# Patient Record
Sex: Female | Born: 1970 | Race: White | Hispanic: No | Marital: Married | Smoking: Former smoker
Health system: Southern US, Community
[De-identification: ages and names within clinical notes are randomized; demographics above are authoritative.]

## PROBLEM LIST (undated history)

## (undated) HISTORY — PX: DILATION AND CURETTAGE OF UTERUS: SHX78

## (undated) HISTORY — PX: CHOLECYSTECTOMY: SHX55

## (undated) HISTORY — PX: HERNIA REPAIR: SHX51

---

## 1998-08-14 ENCOUNTER — Emergency Department (HOSPITAL_COMMUNITY): Admission: EM | Admit: 1998-08-14 | Discharge: 1998-08-14 | Payer: Self-pay | Admitting: Emergency Medicine

## 1998-10-16 ENCOUNTER — Emergency Department (HOSPITAL_COMMUNITY): Admission: EM | Admit: 1998-10-16 | Discharge: 1998-10-16 | Payer: Self-pay | Admitting: *Deleted

## 1999-03-30 ENCOUNTER — Ambulatory Visit (HOSPITAL_COMMUNITY): Admission: RE | Admit: 1999-03-30 | Discharge: 1999-03-30 | Payer: Self-pay | Admitting: Obstetrics & Gynecology

## 2000-01-15 ENCOUNTER — Other Ambulatory Visit: Admission: RE | Admit: 2000-01-15 | Discharge: 2000-01-15 | Payer: Self-pay | Admitting: Obstetrics and Gynecology

## 2000-04-16 ENCOUNTER — Encounter: Payer: Self-pay | Admitting: Obstetrics & Gynecology

## 2000-04-16 ENCOUNTER — Ambulatory Visit (HOSPITAL_COMMUNITY): Admission: RE | Admit: 2000-04-16 | Discharge: 2000-04-16 | Payer: Self-pay | Admitting: Obstetrics & Gynecology

## 2000-07-18 ENCOUNTER — Inpatient Hospital Stay (HOSPITAL_COMMUNITY): Admission: AD | Admit: 2000-07-18 | Discharge: 2000-07-18 | Payer: Self-pay | Admitting: Obstetrics and Gynecology

## 2000-07-19 ENCOUNTER — Inpatient Hospital Stay (HOSPITAL_COMMUNITY): Admission: AD | Admit: 2000-07-19 | Discharge: 2000-07-19 | Payer: Self-pay | Admitting: Obstetrics and Gynecology

## 2000-07-23 ENCOUNTER — Observation Stay (HOSPITAL_COMMUNITY): Admission: AD | Admit: 2000-07-23 | Discharge: 2000-07-24 | Payer: Self-pay | Admitting: Obstetrics and Gynecology

## 2000-07-28 ENCOUNTER — Inpatient Hospital Stay (HOSPITAL_COMMUNITY): Admission: AD | Admit: 2000-07-28 | Discharge: 2000-07-28 | Payer: Self-pay | Admitting: Obstetrics and Gynecology

## 2000-07-28 ENCOUNTER — Encounter: Payer: Self-pay | Admitting: Obstetrics and Gynecology

## 2000-07-29 ENCOUNTER — Inpatient Hospital Stay (HOSPITAL_COMMUNITY): Admission: AD | Admit: 2000-07-29 | Discharge: 2000-07-29 | Payer: Self-pay | Admitting: *Deleted

## 2000-08-07 ENCOUNTER — Inpatient Hospital Stay (HOSPITAL_COMMUNITY): Admission: AD | Admit: 2000-08-07 | Discharge: 2000-08-09 | Payer: Self-pay | Admitting: Obstetrics and Gynecology

## 2000-08-07 ENCOUNTER — Inpatient Hospital Stay (HOSPITAL_COMMUNITY): Admission: AD | Admit: 2000-08-07 | Discharge: 2000-08-07 | Payer: Self-pay | Admitting: Obstetrics and Gynecology

## 2001-01-13 ENCOUNTER — Other Ambulatory Visit: Admission: RE | Admit: 2001-01-13 | Discharge: 2001-01-13 | Payer: Self-pay | Admitting: Obstetrics and Gynecology

## 2003-01-05 ENCOUNTER — Other Ambulatory Visit: Admission: RE | Admit: 2003-01-05 | Discharge: 2003-01-05 | Payer: Self-pay | Admitting: Family Medicine

## 2004-11-10 ENCOUNTER — Ambulatory Visit (HOSPITAL_COMMUNITY): Admission: RE | Admit: 2004-11-10 | Discharge: 2004-11-10 | Payer: Self-pay | Admitting: Obstetrics & Gynecology

## 2005-04-17 ENCOUNTER — Encounter: Admission: RE | Admit: 2005-04-17 | Discharge: 2005-05-09 | Payer: Self-pay | Admitting: Obstetrics

## 2005-05-26 ENCOUNTER — Inpatient Hospital Stay (HOSPITAL_COMMUNITY): Admission: AD | Admit: 2005-05-26 | Discharge: 2005-05-26 | Payer: Self-pay | Admitting: Obstetrics & Gynecology

## 2005-06-05 ENCOUNTER — Inpatient Hospital Stay (HOSPITAL_COMMUNITY): Admission: AD | Admit: 2005-06-05 | Discharge: 2005-06-05 | Payer: Self-pay | Admitting: Obstetrics

## 2005-06-16 ENCOUNTER — Inpatient Hospital Stay (HOSPITAL_COMMUNITY): Admission: AD | Admit: 2005-06-16 | Discharge: 2005-06-16 | Payer: Self-pay | Admitting: Obstetrics & Gynecology

## 2005-06-30 ENCOUNTER — Inpatient Hospital Stay (HOSPITAL_COMMUNITY): Admission: AD | Admit: 2005-06-30 | Discharge: 2005-06-30 | Payer: Self-pay | Admitting: Obstetrics & Gynecology

## 2005-07-08 ENCOUNTER — Inpatient Hospital Stay (HOSPITAL_COMMUNITY): Admission: AD | Admit: 2005-07-08 | Discharge: 2005-07-10 | Payer: Self-pay | Admitting: Maternal and Fetal Medicine

## 2006-02-04 ENCOUNTER — Ambulatory Visit (HOSPITAL_COMMUNITY): Admission: RE | Admit: 2006-02-04 | Discharge: 2006-02-04 | Payer: Self-pay | Admitting: Obstetrics

## 2007-05-23 ENCOUNTER — Other Ambulatory Visit: Admission: RE | Admit: 2007-05-23 | Discharge: 2007-05-23 | Payer: Self-pay | Admitting: Obstetrics and Gynecology

## 2011-01-01 ENCOUNTER — Encounter: Payer: Self-pay | Admitting: Gynecology

## 2011-04-27 NOTE — H&P (Signed)
[facility]  Patient:    Terry Woods, Terry Woods                        MRN: [id number] Adm. Date:  [id number] Disc. Date: [id number] Attending:  Leonard Schwartz Dictator:   Nigel Bridgeman, C.N.M.                         History and Physical  HISTORY OF PRESENT ILLNESS:   Terry Woods is a 40 year old gravida 4, para 2-0-1-2 at 37-3/7 weeks who presents in early labor with a history of several admissions for prodromal labor over the last two weeks.  She also had an external version scheduled on Friday, July 19, 2000, from a transverse lie; however, the fetus at that time had converted to a vertex presentation although the vertex was high.  Her cervix was external os 3, internal os 1-2 at that time.  Over the weekend the patient has had increasing uterine contractions.  She denies any leaking or bleeding and reports positive fetal movement.  The pregnancy has been remarkable for: 1. Patient considering tubal ligation, but undecided at present. 2. History of gestational diabetes with previous pregnancy. 3. Lives one hour from the hospital.  PRENATAL LABORATORY DATA:     Blood type is O positive, Rh antibody negative. VDRL nonreactive.  Rubella titer positive.  Hepatitis B surface antigen negative.  GC and chlamydia cultures were negative.  Parvovirus titers were negative.  Hemoglobin was 13 upon entry into practice, it was 12 at 28 weeks. She had an initial Glucola that was normal, and a 28-week Glucola that was also normal.  AFP was declined.  EDC of August 10, 2000, was established by last menstrual period and was in agreement with ultrasound in the first trimester as well as at 18 weeks.  Group B strep culture was negative at 36 weeks.  HISTORY OF PRESENT ILLNESS:   The patient entered care at approximately 10 weeks.  She had Parvovirus titers done in the first trimester.  She had an ultrasound at approximately 13 weeks.  She had a fasting blood sugar  at 17 weeks which was 66.  She had a Glucola that was normal at 28 weeks.  She had breech presentation at 25 weeks and followed up at 32-34 weeks.  She had external version scheduled at 36 weeks; however, the baby had converted from a transverse presentation.  At 36 weeks cervix was 3, external os fingertip to 1, internal os 50%, vertex was out of the pelvis and was determined to be transverse at ultrasound.  The last two weeks have been remarkable for several episodes of uterine contractions, and she was evaluated several times for this in maternity admissions.  OBSTETRICAL HISTORY:          In 1993, she had vaginal birth of a female infant, weight 7 pounds 7 ounces at [redacted] weeks gestation.  She was in labor approximately 28 hours.  She had epidural anesthesia.  In 1996, she had vaginal birth of a female infant, weight 6 pounds 11 ounces, at [redacted] weeks gestation.  She was in labor approximately 10 hours.  She had no epidural at that time.  She had gestational diabetes during that pregnancy.  That child also had hypospadias.  In 1999, she had a spontaneous miscarriage at 45- to 12-week gestation, had a D&C as follow-up.  PAST MEDICAL HISTORY:  She reports the usual childhood illnesses.  At age 41 she had pyelonephritis.  She was a half-pack-per-day smoker prior to pregnancy but has not smoked since then.  She fractured her right ankle as a teenager playing basketball.  PAST SURGICAL HISTORY:        1. D&C in 1999.                               2. Wisdom teeth removal in 1993.                               3. Pyelonephritis and was hospitalized at age                                  57.  ALLERGIES:                    No known medication allergies.  FAMILY HISTORY:               Her mother and maternal grandmother have hypertension.  Her maternal grandmother had phlebitis.  Her mother, maternal grandmother and maternal aunt all have non-insulin-dependent diabetes.  Her maternal  grandmother had a stroke.  Her maternal grandmother had arthritis. Her maternal grandmother had breast cancer.  GENETIC HISTORY:              The father and the infants brother having a heart murmur.  A maternal aunt had a loss of twins in the first trimester.  SOCIAL HISTORY:               She is married to the father of the baby.  He is involved and supportive.  His name is Terry Woods.  The patient is Caucasian.  She is of the [employer].  She is a housewife and high-school educated.  Her husband is a Naval architect, and he is also high-school educated. She has been followed by the certified nurse midwife service at [facility].  She denies any alcohol or drug use during this pregnancy.  She was a half-pack-per-day smoker prior to her positive urine pregnancy test.  PHYSICAL EXAMINATION:  VITAL SIGNS:                  Stable.  The patient is afebrile.  HEENT:                        Within normal limits.  LUNGS:                        Breath sounds are clear.  HEART:                        Regular.  Without murmur.  BREASTS:                      Soft and nontender.  ABDOMEN:                      Fundal height is approximately 38 cm.  Estimated fetal weight 7 to 7-1/2 pounds.  Uterine contractions every 2-8 minutes with irritability in between, mild to moderate quality.  PELVIC:  On cervical exam, 3-4 cm, 75%, vertex, at a -3 station at least, with the vertex presentation verified by bedside ultrasound. Fetal heart rate is reactive, with no decelerations.  EXTREMITIES:                  Deep tendon reflexes are 2+ without clonus. There is a trace edema noted.  IMPRESSION:                   1. Intrauterine pregnancy at 37-3/7 weeks.                               2. Routine certified nurse midwife orders.                               3. Anticipate initiating Pitocin augmentation to                                  assist with descent of fetal  vertex and then                                  artificial rupture of membranes as able. DD:  07/23/00 TD:  07/23/00 Job: [id number]

## 2011-04-27 NOTE — H&P (Signed)
[facility]  Patient:    Terry Woods, Terry Woods                        MRN: [id number] Adm. Date:  [id number] Attending:  Leonard Schwartz Dictator:   Wynelle Bourgeois, P.A.                         History and Physical  HISTORY OF PRESENT ILLNESS:   The patient is a 40 year old gravida 4, para 2-0-1-2, at 39-4/7 weeks who presents from the office today with increased uterine contractions with some progression in her cervical dilation to 3 cm. She also presented with hypertension in the office today at 140/90 and 1+ proteinuria on a clean catch UA, which was later confirmed as 100 mg on a cath UA.  She has been followed by the midwifery service at [facility] the first trimester and her pregnancy has been remarkable for history of gestational diabetes with her second pregnancy, possibly desires postpartum tubal ligation, and new onset preeclampsia.  PRENATAL LABORATORY DATA:     Hemoglobin 13.0, hematocrit 39.9, platelets 283. Blood type O-positive.  Antibody screen negative.  RPR nonreactive.  Rubella immune HBsAg negative.  Gonorrhea negative.  Chlamydia negative.  Parvovirus negative.  Glucose challenge within normal limits and group B Strep negative.  OBSTETRICAL HISTORY:          Remarkable for a vaginal delivery in March of 1993, of a female infant weighing 7 pounds and 7 ounces, complicated by a nuchal cord under epidural anesthesia.  Her second pregnancy was in October of 1996, with a vaginal delivery of a female infant weighing 6 pounds and 11 ounces at [redacted] weeks gestation, complicated by gestational diabetes, and an infant born with hypospadias and then in April of 1999, she had a spontaneous abortion at 9-[redacted] weeks gestation with a D&C.  No complications.  PAST MEDICAL HISTORY:         Remarkable for pyelonephritis at age 47.  PAST SURGICAL HISTORY:        Remarkable for fracture of her ankle as a teenager, wisdom tooth extraction in 1993, D&C  in 1999.  FAMILY HISTORY:               Remarkable for mother and grandmother with hypertension.  Mother and grandmother with non-insulin-dependent diabetes. Maternal grandmother with stroke and arthritis and breast cancer.  GENETIC HISTORY:              Remarkable for the father of the babys brother who has a heart murmur and a maternal aunt who had a miscarriage of twins.  SOCIAL HISTORY:               The patient is married to [employer] who is involved and supportive.  They are of the [facility] [employer] [facility] faith.  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  PHYSICAL EXAMINATION:  HEENT:                        Within normal limits, although remarkable for traced 1+ edema of face.  NECK:                         Thyroid is normal, not enlarged.  CHEST:  Clear to auscultation bilaterally.  HEART:                        Regular rate and rhythm.  ABDOMEN:                      Gravid at 39 cm, vertex Vale, soft, and nontender.  Electronic fetal monitoring reveals a reactive fetal heart rate tracing with average variability, and uterine contractions every two to four minutes.  CERVICAL EXAMINATION:         Is 4-5 cm, 80% effaced, and minus 1 station with a ballotable vertex and a bulging bag of water.  EXTREMITIES:                  Show 1+ edema of hands and legs.  LABORATORY DATA:              Cath UA reveals 100 mg of protein.  Platelets and hemoglobin within normal limits, and metabolic panel results are pending.  ASSESSMENT:                   1. Intrauterine pregnancy at 39-4/7 weeks.                               2. Preeclampsia.                               3. Early active labor.                               4. Group B Streptococcus negative.  PLAN:                         1. Admit to birthing suite.                               2. Dr. Kathryne Sharper notified with routine MD                                  orders.                                3. Magnesium sulfate 4 g loading dose IV, and                                  then 2 g/hr.                               4. Further orders per Dr. Kathryne Sharper. DD:  08/07/00 TD:  08/07/00 Job: [id number]

## 2011-04-27 NOTE — Discharge Summary (Signed)
[facility]  Patient:    Terry Woods, Terry Woods                        MRN: [id number] Adm. Date:  [id number] Disc. Date: [id number] Attending:  Shaune Spittle Dictator:   Terry Woods, C.N.M.                           Discharge Summary  ADMITTING DIAGNOSES:          1. Intrauterine pregnancy at 37 and                                  three-sevenths weeks.                               2. Prolonged term labor.                               3. ______ .  DISCHARGE DIAGNOSES:          1. Term pregnancy.                               2. Unfavorable cervix.                               3. Prolonged attempted labor augmentation.  PROCEDURES:                   Pitocin augmentation.  HOSPITAL COURSE:              Terry Woods is a 40 year old gravida 4, para 2-0-1-2 at 25 and three-sevenths weeks, who presented on July 23, 2000 with uterine contractions every five minutes.  The fetus was previously transversed with a spontaneous change to vertex last week.  She lives approximately one hour from the hospital.  She has had approximately one week of prodromal labor.  ______ :                     1. Considering tubal ligation but anticipates                                  deciding against it at this time.                               2. History of gestational diabetes in her                                  previous pregnancy.                               3. Prolonged prodromal labor.  HOSPITAL COURSE:              On admission, cervix was 3-4 cm, 75% vertex at a -3, -4 and ballottable.  Vertex was verified per bedside ultrasound.  This was a slight cervical change  since last Friday.  The patient was admitted for labor augmentation if no further progress was made.  Pitocin was begun approximately noon.  The patient was evaluated several times over the next 18 hours.  Cervix remained very soft, very high, and vertex was -3, -4, and ballottable.   Cervix did not change.  Fetal heart rate remained reactive with a negative OCT.  The patient did have good response to Pitocin with contractions every two to three minutes but never was significantly uncomfortable.  She did receive two doses of Stadol for comfort to which she responded well, but by 5:30 in the morning on July 24, 2000, cervix had demo no further change.  Fetal heart rate was reactive.  Maternal vital signs were stable.  Review of the plan and situation was undertaken with Dr. Pennie Rushing and the patient.  The decision was made to discharge the patient home secondary to unfavorable cervix and lack of necessity of forging ahead with further induction methods.  The patient was agreeable with this plan.  Therefore, she was discharged home.  DISCHARGE INSTRUCTIONS:       Per antenatal handout.  DISCHARGE MEDICATIONS:        None.  DISCHARGE FOLLOW-UP:          Will occur on Friday with Terry Woods, C.N.M. or p.r.n. DD:  07/24/00 TD:  07/24/00 Job: [id number]

## 2013-02-25 ENCOUNTER — Encounter (INDEPENDENT_AMBULATORY_CARE_PROVIDER_SITE_OTHER): Payer: Self-pay

## 2013-02-25 ENCOUNTER — Ambulatory Visit: Payer: Self-pay | Admitting: Obstetrics & Gynecology

## 2013-02-27 ENCOUNTER — Ambulatory Visit (INDEPENDENT_AMBULATORY_CARE_PROVIDER_SITE_OTHER): Payer: BC Managed Care – PPO | Admitting: Surgery

## 2013-02-27 ENCOUNTER — Encounter (INDEPENDENT_AMBULATORY_CARE_PROVIDER_SITE_OTHER): Payer: Self-pay | Admitting: Surgery

## 2013-02-27 VITALS — BP 116/72 | HR 72 | Temp 97.7°F | Resp 18 | Ht 61.5 in | Wt 194.6 lb

## 2013-02-27 DIAGNOSIS — K439 Ventral hernia without obstruction or gangrene: Secondary | ICD-10-CM

## 2013-02-27 NOTE — Patient Instructions (Signed)
Hernia A hernia occurs when an internal organ pushes out through a weak spot in the abdominal wall. Hernias most commonly occur in the groin and around the navel. Hernias often can be pushed back into place (reduced). Most hernias tend to get worse over time. Some abdominal hernias can get stuck in the opening (irreducible or incarcerated hernia) and cannot be reduced. An irreducible abdominal hernia which is tightly squeezed into the opening is at risk for impaired blood supply (strangulated hernia). A strangulated hernia is a medical emergency. Because of the risk for an irreducible or strangulated hernia, surgery may be recommended to repair a hernia. CAUSES   Heavy lifting.  Prolonged coughing.  Straining to have a bowel movement.  A cut (incision) made during an abdominal surgery. HOME CARE INSTRUCTIONS   Bed rest is not required. You may continue your normal activities.  Avoid lifting more than 10 pounds (4.5 kg) or straining.  Cough gently. If you are a smoker it is best to stop. Even the best hernia repair can break down with the continual strain of coughing. Even if you do not have your hernia repaired, a cough will continue to aggravate the problem.  Do not wear anything tight over your hernia. Do not try to keep it in with an outside bandage or truss. These can damage abdominal contents if they are trapped within the hernia sac.  Eat a normal diet.  Avoid constipation. Straining over long periods of time will increase hernia size and encourage breakdown of repairs. If you cannot do this with diet alone, stool softeners may be used. SEEK IMMEDIATE MEDICAL CARE IF:   You have a fever.  You develop increasing abdominal pain.  You feel nauseous or vomit.  Your hernia is stuck outside the abdomen, looks discolored, feels hard, or is tender.  You have any changes in your bowel habits or in the hernia that are unusual for you.  You have increased pain or swelling around the  hernia.  You cannot push the hernia back in place by applying gentle pressure while lying down. MAKE SURE YOU:   Understand these instructions.  Will watch your condition.  Will get help right away if you are not doing well or get worse. Document Released: 11/26/2005 Document Revised: 02/18/2012 Document Reviewed: 07/15/2008 [facility] Patient Information 2013 [place], [place].  Hernia, Surgical Repair Care After Refer to this sheet in the next few weeks. These discharge instructions provide you with general information on caring for yourself after you leave the hospital. Your caregiver may also give you specific instructions. Your treatment has been planned according to the most current medical practices available, but unavoidable complications sometimes occur. If you have any problems or questions after discharge, please call your caregiver. HOME CARE INSTRUCTIONS   It is normal to be sore for a couple weeks after surgery. See your caregiver if this seems to be getting worse rather than better.  Put ice on the operative site.  Put ice in a plastic bag.  Place a towel between your skin and the bag.  Leave the ice on for 15 to 20 minutes at a time, 3 to 4 times a day for the first 2 days.  Change bandages (dressings) as directed.  Keep the wound dry and clean. The wound may be washed gently with soap and water. Gently blot or dab the wound dry. Do not take baths, use swimming pools, or use hot tubs for 10 days, or as directed by your caregiver.  Only take over-the-counter or prescription medicines for pain, discomfort, or fever as directed by your caregiver.  Continue your normal diet as directed.  Do not drive until your caregiver says it is okay.  Do not lift anything more than 10 pounds or play contact sports for 3 weeks, or as directed.  Make an appointment to see your caregiver for stitches (sutures) or staple removal when instructed. SEEK MEDICAL CARE IF:   You have  increased bleeding coming from the wounds.  You have blood in your stool.  You see redness, swelling, or have increasing pain in the wounds.  You have fluid (pus) coming from the wound.  You have an oral temperature above 102 F (38.9 C).  You notice a bad smell coming from the wound or dressing.  You develop lightheadedness or feel faint. SEEK IMMEDIATE MEDICAL CARE IF:   You develop a rash.  You have difficulty breathing.  You develop any reaction or side effects to medicines given. MAKE SURE YOU:   Understand these instructions.  Will watch your condition.  Will get help right away if you are not doing well or get worse. Document Released: 06/15/2005 Document Revised: 02/18/2012 Document Reviewed: 10/26/2009 [facility] Patient Information 2013 [place], [place]. Hernia, Surgical Repair A hernia occurs when an internal organ pushes out through a weak spot in the belly (abdominal) wall muscles. Hernias commonly occur in the groin and around the navel. Hernias often can be pushed back into place (reduced). Most hernias tend to get worse over time. Problems occur when abdominal contents get stuck in the opening (incarcerated hernia). The blood supply gets cut off (strangulated hernia). This is an emergency and needs surgery. Otherwise, hernia repair can be an elective procedure. This means you can schedule this at your convenience when an emergency is not present. Because complications can occur, if you decide to repair the hernia, it is best to do it soon. When it becomes an emergency procedure, there is increased risk of complications after surgery. CAUSES   Heavy lifting.  Obesity.  Prolonged coughing.  Straining to move your bowels.  Hernias can also occur through a cut (incision) by a surgeonafter an abdominal operation. HOME CARE INSTRUCTIONS Before the repair:  Bed rest is not required. You may continue your normal activities, but avoid heavy lifting (more than 10  pounds) or straining. Cough gently. If you are a smoker, it is best to stop. Even the best hernia repair can break down with the continual strain of coughing.  Do not wear anything tight over your hernia. Do not try to keep it in with an outside bandage or truss. These can damage abdominal contents if they are trapped in the hernia sac.  Eat a normal diet. Avoid constipation. Straining over long periods of time to have a bowel movement will increase hernia size. It also can breakdown repairs. If you cannot do this with diet alone, laxatives or stool softeners may be used. PRIOR TO SURGERY, SEEK IMMEDIATE MEDICAL CARE IF: You have problems (symptoms) of a trapped (incarcerated) hernia. Symptoms include:  An oral temperature above 102 F (38.9 C) develops, or as your caregiver suggests.  Increasing abdominal pain.  Feeling sick to your stomach(nausea) and vomiting.  You stop passing gas or stool.  The hernia is stuck outside the abdomen, looks discolored, feels hard, or is tender.  You have any changes in your bowel habits or in the hernia that is unusual for you. LET YOUR CAREGIVERS KNOW ABOUT THE FOLLOWING:  Allergies.  Medications taken including herbs, eye drops, over the counter medications, and creams.  Use of steroids (by mouth or creams).  Family or personal history of problems with anesthetics or Novocaine.  Possibility of pregnancy, if this applies.  Personal history of blood clots (thrombophlebitis).  Family or personal history of bleeding or blood problems.  Previous surgery.  Other health problems. BEFORE THE PROCEDURE You should be present 1 hour prior to your procedure, or as directed by your caregiver.  AFTER THE PROCEDURE After surgery, you will be taken to the recovery area. A nurse will watch and check your progress there. Once you are awake, stable, and taking fluids well, you will be allowed to go home as long as there are no problems. Once home, an ice  pack (wrapped in a light towel) applied to your operative site may help with discomfort. It may also keep the swelling down. Do not lift anything heavier than 10 pounds (4.55 kilograms). Take showers not baths. Do not drive while taking narcotics. Follow instructions as suggested by your caregiver.  SEEK IMMEDIATE MEDICAL CARE IF: After surgery:  There is redness, swelling, or increasing pain in the wound.  There is pus coming from the wound.  There is drainage from a wound lasting longer than 1 day.  An unexplained oral temperature above 102 F (38.9 C) develops.  You notice a foul smell coming from the wound or dressing.  There is a breaking open of a wound (edged not staying together) after the sutures have been removed.  You notice increasing pain in the shoulders (shoulder strap areas).  You develop dizzy episodes or fainting while standing.  You develop persistent nausea or vomiting.  You develop a rash.  You have difficulty breathing.  You develop any reaction or side effects to medications given. MAKE SURE YOU:   Understand these instructions.  Will watch your condition.  Will get help right away if you are not doing well or get worse. Document Released: 05/22/2001 Document Revised: 02/18/2012 Document Reviewed: 04/13/2008 [facility] Patient Information 2013 [place], [place].

## 2013-02-27 NOTE — Progress Notes (Signed)
Patient ID: Terry Woods, female   DOB: 05/13/1971, 42 y.o.   MRN: [id number]  No chief complaint on file.   HPI Terry Woods is a 42 y.o. female.  Patient sent at request of Dr.Taavon for a umbilical hernia. She underwent a laparoscopic cholecystectomy in 2004 and had a repair of this umbilical hernia then without mesh. She developed a bulge around her umbilicus 6 months ago and it got larger. It pops in and out. Mild to moderate discomfort on her umbilicus which is dull in nature.no nausea or vomiting. Some urinary incontinence. HPI  No past medical history on file.  Past Surgical History  Procedure Laterality Date  . Dilation and curettage of uterus    . Cholecystectomy    . Hernia repair      Family History  Problem Relation Age of Onset  . Diabetes Mother   . Diabetes Sister   . Hypertension Mother   . Breast cancer Maternal Grandmother   . Lung cancer Maternal Grandmother     Social History History  Substance Use Topics  . Smoking status: Never Smoker   . Smokeless tobacco: Not on file  . Alcohol Use: No    Allergies  Allergen Reactions  . Sulfa Antibiotics     No current outpatient prescriptions on file.   No current facility-administered medications for this visit.    Review of Systems Review of Systems  Constitutional: Negative.   HENT: Negative.   Eyes: Negative.   Respiratory: Negative.   Cardiovascular: Negative.   Gastrointestinal: Negative.   Genitourinary: Positive for urgency.  Musculoskeletal: Negative.   Skin: Negative.   Neurological: Negative.   Hematological: Negative.   Psychiatric/Behavioral: Negative.     Blood pressure 116/72, pulse 72, temperature 97.7 F (36.5 C), resp. rate 18, height 5' 1.5" (1.562 m), weight 194 lb 9.6 oz (88.27 kg).  Physical Exam Physical Exam  Constitutional: She is oriented to person, place, and time. She appears well-developed and well-nourished.  HENT:  Head: Normocephalic and atraumatic.  Eyes:  EOM are normal. Pupils are equal, round, and reactive to light.  Neck: Normal range of motion. Neck supple.  Cardiovascular: Normal rate and regular rhythm.   Pulmonary/Chest: Effort normal and breath sounds normal.  Abdominal: She exhibits no distension. There is no tenderness.    Musculoskeletal: Normal range of motion.  Neurological: She is alert and oriented to person, place, and time.  Skin: Skin is warm and dry.  Psychiatric: She has a normal mood and affect. Her behavior is normal. Thought content normal.      Assessment    Recurrent periumbilical/  Ventral hernia reducible    Plan    Discussed options of repair. This can be done at same time as her gynecological procedure. Since mesh were need to be use, there may be a slight increased risk of infection at the hernia site. She would like to go ahead and proceed with repair of her periumbilical/ventral hernia at this time.The risk of hernia repair include bleeding,  Infection,   Recurrence of the hernia,  Mesh use, chronic pain,  Organ injury,  Bowel injury,  Bladder injury,   nerve injury with numbness around the incision,  Death,  and worsening of preexisting  medical problems.  The alternatives to surgery have been discussed as well..  Long term expectations of both operative and non operative treatments have been discussed.   The patient agrees to proceed.       [name]. 02/27/2013, 9:57 AM

## 2014-10-14 ENCOUNTER — Telehealth: Payer: Self-pay | Admitting: Family Medicine

## 2014-10-15 NOTE — Telephone Encounter (Signed)
error 

## 2016-02-21 ENCOUNTER — Other Ambulatory Visit: Payer: Self-pay

## 2016-02-21 DIAGNOSIS — Z1231 Encounter for screening mammogram for malignant neoplasm of breast: Secondary | ICD-10-CM

## 2016-03-01 ENCOUNTER — Encounter: Payer: Self-pay | Admitting: Certified Nurse Midwife

## 2016-03-01 ENCOUNTER — Ambulatory Visit (INDEPENDENT_AMBULATORY_CARE_PROVIDER_SITE_OTHER): Payer: Medicaid Other | Admitting: Certified Nurse Midwife

## 2016-03-01 DIAGNOSIS — Z0000 Encounter for general adult medical examination without abnormal findings: Secondary | ICD-10-CM

## 2016-03-01 DIAGNOSIS — Z01419 Encounter for gynecological examination (general) (routine) without abnormal findings: Secondary | ICD-10-CM

## 2016-03-01 DIAGNOSIS — N393 Stress incontinence (female) (male): Secondary | ICD-10-CM | POA: Diagnosis not present

## 2016-03-01 LAB — POCT URINALYSIS DIPSTICK
Bilirubin, UA: NEGATIVE
Blood, UA: NEGATIVE
GLUCOSE UA: NEGATIVE
Ketones, UA: NEGATIVE
NITRITE UA: NEGATIVE
PROTEIN UA: NEGATIVE
SPEC GRAV UA: 1.02
UROBILINOGEN UA: NEGATIVE
pH, UA: 5

## 2016-03-01 NOTE — Progress Notes (Signed)
Patient ID: Terry Woods, female   DOB: 1971-10-10, 45 y.o.   MRN: [id number]    Subjective:      Terry Woods is a 45 y.o. female here for a routine exam.  Current complaints: none.  Skipped period for 6 months, currently irregular, has had period for the last 2 months, lasting about 7 days with light bleeding.  Denies any chronic medical conditions.    Currently sexually active.  Has issues with stress incontinence.  Has had for 3 years.  States that she has incontinence when she sneezes, laughs, coughs and certain movements.  Wears panty liners.  Denies any frequency, urgency, or burning.  Declines STD screening.    Personal health questionnaire:  Is patient Ashkenazi Jewish, have a family history of breast and/or ovarian cancer: yes Is there a family history of uterine cancer diagnosed at age < 21, gastrointestinal cancer, urinary tract cancer, family member who is a Personnel officer syndrome-associated carrier: no Is the patient overweight and hypertensive, family history of diabetes, personal history of gestational diabetes, preeclampsia or PCOS: yes Is patient over 63, have PCOS,  family history of premature CHD under age 31, diabetes, smoke, have hypertension or peripheral artery disease:  yes At any time, has a partner hit, kicked or otherwise hurt or frightened you?: no Over the past 2 weeks, have you felt down, depressed or hopeless?: no Over the past 2 weeks, have you felt little interest or pleasure in doing things?:no   Gynecologic History Patient's last menstrual period was 02/16/2016 (approximate). Contraception: none Last Pap: unknown. Results were: normal according to the patient Last mammogram: unknown. Results were: normal according to the patient  Obstetric History OB History  No data available    History reviewed. No pertinent past medical history.  Past Surgical History  Procedure Laterality Date  . Dilation and curettage of uterus    . Cholecystectomy    . Hernia  repair      No current outpatient prescriptions on file. Allergies  Allergen Reactions  . Sulfa Antibiotics     Social History  Substance Use Topics  . Smoking status: Former Smoker    Quit date: 03/01/1996  . Smokeless tobacco: Never Used  . Alcohol Use: 0.0 oz/week    0 Standard drinks or equivalent per week     Comment: social     Family History  Problem Relation Age of Onset  . Diabetes Mother   . Hypertension Mother   . Diabetes Sister   . Breast cancer Maternal Grandmother   . Lung cancer Maternal Grandmother   . Heart disease Father       Review of Systems  Constitutional: negative for fatigue and weight loss Respiratory: negative for cough and wheezing Cardiovascular: negative for chest pain, fatigue and palpitations Gastrointestinal: negative for abdominal pain and change in bowel habits Musculoskeletal:negative for myalgias Neurological: negative for gait problems and tremors Behavioral/Psych: negative for abusive relationship, depression Endocrine: negative for temperature intolerance   Genitourinary:negative for abnormal menstrual periods, genital lesions, hot flashes, sexual problems and vaginal discharge, + irregular periods, + stress incontinence Integument/breast: negative for breast lump, breast tenderness, nipple discharge and skin lesion(s)    Objective:       LMP 02/16/2016 (Approximate) General:   alert  Skin:   no rash or abnormalities  Lungs:   clear to auscultation bilaterally  Heart:   regular rate and rhythm, S1, S2 normal, no murmur, click, rub or gallop  Breasts:   normal without suspicious masses, skin  or nipple changes or axillary nodes  Abdomen:  normal findings: no organomegaly, soft, non-tender and no hernia obese  Pelvis:  External genitalia: normal general appearance Urinary system: urethral meatus normal and bladder without fullness, nontender Vaginal: normal without tenderness, induration or masses Cervix: normal  appearance Adnexa: normal bimanual exam Uterus: anteverted and non-tender, normal size   Lab Review Urine pregnancy test Labs reviewed yes Radiologic studies reviewed no  50% of 30 min visit spent on counseling and coordination of care.   Assessment:    Healthy female exam.   Stress incontinence  Plan:    Education reviewed: calcium supplements, depression evaluation, low fat, low cholesterol diet, safe sex/STD prevention, self breast exams, skin cancer screening and weight bearing exercise. Contraception: none. Mammogram ordered. Follow up in: 1 year.     No orders of the defined types were placed in this encounter.   Orders Placed This Encounter  Procedures  . Ambulatory referral to Urology    Referral Priority:  Routine    Referral Type:  Consultation    Referral Reason:  Specialty Services Required    Requested Specialty:  Urology    Number of Visits Requested:  1  . POCT urinalysis dipstick    Possible management options include: Dr. Ashley RoyaltyMatthews consult if periods are worse Follow up as needed.

## 2016-03-01 NOTE — Patient Instructions (Signed)
Kegel Exercises The goal of Kegel exercises is to isolate and exercise your pelvic floor muscles. These muscles act as a hammock that supports the rectum, vagina, small intestine, and uterus. As the muscles weaken, the hammock sags and these organs are displaced from their normal positions. Kegel exercises can strengthen your pelvic floor muscles and help you to improve bladder and bowel control, improve sexual response, and help reduce many problems and some discomfort during pregnancy. Kegel exercises can be done anywhere and at any time. HOW TO PERFORM KEGEL EXERCISES 1. Locate your pelvic floor muscles. To do this, squeeze (contract) the muscles that you use when you try to stop the flow of urine. You will feel a tightness in the vaginal area (women) and a tight lift in the rectal area (men and women). 2. When you begin, contract your pelvic muscles tight for 2-5 seconds, then relax them for 2-5 seconds. This is one set. Do 4-5 sets with a short pause in between. 3. Contract your pelvic muscles for 8-10 seconds, then relax them for 8-10 seconds. Do 4-5 sets. If you cannot contract your pelvic muscles for 8-10 seconds, try 5-7 seconds and work your way up to 8-10 seconds. Your goal is 4-5 sets of 10 contractions each day. Keep your stomach, buttocks, and legs relaxed during the exercises. Perform sets of both short and long contractions. Vary your positions. Perform these contractions 3-4 times per day. Perform sets while you are:   Lying in bed in the morning.  Standing at lunch.  Sitting in the late afternoon.  Lying in bed at night. You should do 40-50 contractions per day. Do not perform more Kegel exercises per day than recommended. Overexercising can cause muscle fatigue. Continue these exercises for for at least 15-20 weeks or as directed by your caregiver.   This information is not intended to replace advice given to you by your health care provider. Make sure you discuss any questions  you have with your health care provider.   Document Released: 11/12/2012 Document Revised: 12/17/2014 Document Reviewed: 11/12/2012 Elsevier Interactive Patient Education 2016 [employer]. Kegel Exercises The goal of Kegel exercises is to isolate and exercise your pelvic floor muscles. These muscles act as a hammock that supports the rectum, vagina, small intestine, and uterus. As the muscles weaken, the hammock sags and these organs are displaced from their normal positions. Kegel exercises can strengthen your pelvic floor muscles and help you to improve bladder and bowel control, improve sexual response, and help reduce many problems and some discomfort during pregnancy. Kegel exercises can be done anywhere and at any time. HOW TO PERFORM KEGEL EXERCISES 4. Locate your pelvic floor muscles. To do this, squeeze (contract) the muscles that you use when you try to stop the flow of urine. You will feel a tightness in the vaginal area (women) and a tight lift in the rectal area (men and women). 5. When you begin, contract your pelvic muscles tight for 2-5 seconds, then relax them for 2-5 seconds. This is one set. Do 4-5 sets with a short pause in between. 6. Contract your pelvic muscles for 8-10 seconds, then relax them for 8-10 seconds. Do 4-5 sets. If you cannot contract your pelvic muscles for 8-10 seconds, try 5-7 seconds and work your way up to 8-10 seconds. Your goal is 4-5 sets of 10 contractions each day. Keep your stomach, buttocks, and legs relaxed during the exercises. Perform sets of both short and long contractions. Vary your positions. Perform  these contractions 3-4 times per day. Perform sets while you are:   Lying in bed in the morning.  Standing at lunch.  Sitting in the late afternoon.  Lying in bed at night. You should do 40-50 contractions per day. Do not perform more Kegel exercises per day than recommended. Overexercising can cause muscle fatigue. Continue these exercises  for for at least 15-20 weeks or as directed by your caregiver.   This information is not intended to replace advice given to you by your health care provider. Make sure you discuss any questions you have with your health care provider.   Document Released: 11/12/2012 Document Revised: 12/17/2014 Document Reviewed: 11/12/2012 Elsevier Interactive Patient Education [employer]2016 Elsevier Inc.

## 2016-03-02 ENCOUNTER — Encounter: Payer: Self-pay | Admitting: Certified Nurse Midwife

## 2016-03-02 DIAGNOSIS — N393 Stress incontinence (female) (male): Secondary | ICD-10-CM | POA: Insufficient documentation

## 2016-03-04 LAB — PAP IG AND HPV HIGH-RISK
HPV, HIGH-RISK: NEGATIVE
PAP Smear Comment: 0

## 2016-03-07 ENCOUNTER — Telehealth: Payer: Self-pay | Admitting: Certified Nurse Midwife

## 2016-03-07 NOTE — Telephone Encounter (Signed)
Pt called wanting to know if someone could give her the results from her annual exam last week. Please advise

## 2016-03-08 ENCOUNTER — Ambulatory Visit: Payer: Self-pay

## 2016-03-08 ENCOUNTER — Telehealth: Payer: Self-pay

## 2016-03-08 NOTE — Telephone Encounter (Signed)
PATIENT HAS APPT WITH [facility] ON 04/09/16 WITH SCOTT MACDIARMID, MD AT 1PM - THEY WILL CALL PATIENT AND LET HER KNOW AND ALSO NEW PATIENT PACKET

## 2016-03-21 NOTE — Telephone Encounter (Signed)
Pt made aware of results.

## 2017-05-20 ENCOUNTER — Encounter (INDEPENDENT_AMBULATORY_CARE_PROVIDER_SITE_OTHER): Payer: Self-pay | Admitting: Orthopaedic Surgery

## 2017-05-20 ENCOUNTER — Ambulatory Visit (INDEPENDENT_AMBULATORY_CARE_PROVIDER_SITE_OTHER): Payer: Medicaid Other | Admitting: Orthopaedic Surgery

## 2017-05-20 ENCOUNTER — Ambulatory Visit (INDEPENDENT_AMBULATORY_CARE_PROVIDER_SITE_OTHER): Payer: Medicaid Other

## 2017-05-20 DIAGNOSIS — M2241 Chondromalacia patellae, right knee: Secondary | ICD-10-CM

## 2017-05-20 NOTE — Progress Notes (Signed)
Office Visit Note   Patient: Terry RuffingCathy Woods           Date of Birth: 01-03-1971           MRN: [id number] Visit Date: 05/20/2017              Requested by: Kaleen MaskElkins, Wilson Oliver, MD [address] [place], [place] [id number] PCP: Kaleen MaskElkins, Wilson Oliver, MD   Assessment & Plan: Visit Diagnoses:  1. Chondromalacia, patella, right     Plan: Overall impression is right chondromalacia patella. Recommend right knee brace, home exercises, NSAIDs as needed.  Follow-Up Instructions: Return if symptoms worsen or fail to improve.   Orders:  Orders Placed This Encounter  Procedures  . XR KNEE 3 VIEW RIGHT   No orders of the defined types were placed in this encounter.     Procedures: No procedures performed   Clinical Data: No additional findings.   Subjective: Chief Complaint  Patient presents with  . Right Knee - Pain    Patient is a 46 year old female comes in with right knee pain around the lateral aspect of the patella. She denies any injuries. She states this is been going on for about a week and it throbs at night. She is not had any surgeries in the past. She denies any mechanical symptoms other than some popping. The pain does not radiate.    Review of Systems  Constitutional: Negative.   HENT: Negative.   Eyes: Negative.   Respiratory: Negative.   Cardiovascular: Negative.   Endocrine: Negative.   Musculoskeletal: Negative.   Neurological: Negative.   Hematological: Negative.   Psychiatric/Behavioral: Negative.   All other systems reviewed and are negative.    Objective: Vital Signs: There were no vitals taken for this visit.  Physical Exam  Constitutional: She is oriented to person, place, and time. She appears well-developed and well-nourished.  HENT:  Head: Normocephalic and atraumatic.  Eyes: EOM are normal.  Neck: Neck supple.  Pulmonary/Chest: Effort normal.  Abdominal: Soft.  Neurological: She is alert and oriented to person, place, and  time.  Skin: Skin is warm. Capillary refill takes less than 2 seconds.  Psychiatric: She has a normal mood and affect. Her behavior is normal. Judgment and thought content normal.  Nursing note and vitals reviewed.   Ortho Exam Right knee exam shows no joint effusion. She has full range of motion with positive patellofemoral crepitus. Collaterals and cruciates are stable. No significant joint line tenderness. Patellar tracking is normal. Specialty Comments:  No specialty comments available.  Imaging: Xr Knee 3 View Right  Result Date: 05/20/2017 No acute or structural abnormalities    PMFS History: Patient Active Problem List   Diagnosis Date Noted  . 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 Stress incontinence 03/02/2016   No past medical history on file.  Family History  Problem Relation Age of Onset  . Diabetes Mother   . Hypertension Mother   . Diabetes Sister   . Breast cancer Maternal Grandmother   . Lung cancer Maternal Grandmother   . Heart disease Father     Past Surgical History:  Procedure Laterality Date  . CHOLECYSTECTOMY    . DILATION AND CURETTAGE OF UTERUS    . HERNIA REPAIR     Social History   Occupational History  . Not on file.   Social History Main Topics  . Smoking status: Former Smoker    Quit date: 03/01/1996  . Smokeless tobacco: Never Used  . Alcohol use  0.0 oz/week     Comment: social   . Drug use: No  . Sexual activity: Yes    Partners: Male    Birth control/ protection: None

## 2019-08-04 ENCOUNTER — Encounter: Payer: Self-pay | Admitting: Orthopaedic Surgery

## 2019-08-04 ENCOUNTER — Ambulatory Visit (INDEPENDENT_AMBULATORY_CARE_PROVIDER_SITE_OTHER): Payer: Medicaid Other

## 2019-08-04 ENCOUNTER — Ambulatory Visit (INDEPENDENT_AMBULATORY_CARE_PROVIDER_SITE_OTHER): Payer: Medicaid Other | Admitting: Orthopaedic Surgery

## 2019-08-04 DIAGNOSIS — M542 Cervicalgia: Secondary | ICD-10-CM

## 2019-08-04 DIAGNOSIS — M79671 Pain in right foot: Secondary | ICD-10-CM

## 2019-08-04 NOTE — Progress Notes (Signed)
Office Visit Note   Patient: Terry RuffingCathy Schoenberger           Date of Birth: 03/26/1971           MRN: [id number] Visit Date: 08/04/2019              Requested by: Kaleen MaskElkins, Wilson Oliver, MD [address] [place] [id number] PCP: Kaleen MaskElkins, Wilson Oliver, MD   Assessment & Plan: Visit Diagnoses:  1. Cervicalgia   2. Right foot pain     Plan: Impression is right foot peroneal tendinitis and left sided cervical spine radiculopathy.  We have recommended using OTC voltaren gel as well as send the patient to formal physical therapy.  She will follow-up with us as needed.  Call with concerns or questions.  Follow-Up Instructions: Return if symptoms worsen or fail to improve.   Orders:  Orders Placed This Encounter  Procedures  . XR Foot Complete Right  . XR Cervical Spine 2 or 3 views   No orders of the defined types were placed in this encounter.     Procedures: No procedures performed   Clinical Data: No additional findings.   Subjective: No chief complaint on file.   HPI patient is a pleasant 48 year old female presents our clinic today with left-sided neck and right lateral foot pain.  In regards to her foot, the pain she has is to the lateral aspect.  This has been ongoing for the past 3 to 4 weeks and is progressively worsened.  No known injury or change in activity.  She has no pain at rest.  The pain she has occurs when she is been standing for long period of time as well as when she uses the gas or brake.  No numbness, tingling or burning.  She has tried Tylenol without relief of symptoms.  She does note a remote fracture to her right foot back in hospital.  In regards to her neck, the pain she has is to the left lateral neck and occasionally into the shoulder and parascapular region.  This is aggravated with stress.  She has been using joint cream without relief of symptoms.  She also notes occasional burn to the left lateral neck  Review of Systems as detailed in  HPI.  All others reviewed and are negative.   Objective: Vital Signs: There were no vitals taken for this visit.  Physical Exam well-developed well-nourished female no acute distress.  Alert and oriented x3.  Ortho Exam examination of her right foot reveals moderate tenderness along the fifth meta tarsal at the peroneal attachment.  Increased pain with plantar flexion.  She is neurovascular intact distally.  Cervical spine she is full range of motion without pain.  She has slight tenderness along the lateral neck muscles.  Specialty Comments:  No specialty comments available.  Imaging: Xr Cervical Spine 2 Or 3 Views  Result Date: 08/04/2019 X-rays demonstrate abnormal straightening of the cervical spine with marked degenerative changes at C5-6 and C6-7  Xr Foot Complete Right  Result Date: 08/04/2019 X-rays are negative for acute findings.  Accessory navicular and os peroneus.      PMFS History: Patient Active Problem List   Diagnosis Date Noted  . 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 Stress incontinence 03/02/2016   History reviewed. No pertinent past medical history.  Family History  Problem Relation Age of Onset  . Diabetes Mother   . Hypertension Mother   . Diabetes Sister   . Breast cancer Maternal  Grandmother   . Lung cancer Maternal Grandmother   . Heart disease Father     Past Surgical History:  Procedure Laterality Date  . CHOLECYSTECTOMY    . DILATION AND CURETTAGE OF UTERUS    . HERNIA REPAIR     Social History   Occupational History  . Not on file  Tobacco Use  . Smoking status: Former Smoker    Quit date: 03/01/1996    Years since quitting: 23.4  . Smokeless tobacco: Never Used  Substance and Sexual Activity  . Alcohol use: Yes    Alcohol/week: 0.0 standard drinks    Comment: social   . Drug use: No  . Sexual activity: Yes    Partners: Male    Birth control/protection: None

## 2019-08-05 ENCOUNTER — Encounter: Payer: Self-pay | Admitting: Orthopaedic Surgery

## 2019-10-16 ENCOUNTER — Telehealth: Payer: Self-pay | Admitting: Orthopaedic Surgery

## 2019-10-16 DIAGNOSIS — M79671 Pain in right foot: Secondary | ICD-10-CM

## 2019-10-16 NOTE — Telephone Encounter (Signed)
Please advise 

## 2019-10-16 NOTE — Telephone Encounter (Signed)
Yes that's fine.  R/o structural abnormalities.

## 2019-10-16 NOTE — Telephone Encounter (Signed)
Pt called in said she tried doing voltaren gel for her foot and she's still having pain on her foot so she's wondering if you could put in a referral in for an mri of her right foot/ankle. Please give pt a call [phone]

## 2019-10-19 NOTE — Telephone Encounter (Signed)
Order entered. I called patient and advised. She will call and schedule ROV for MRI review once scan has been scheduled.

## 2019-10-29 ENCOUNTER — Other Ambulatory Visit: Payer: Medicaid Other

## 2019-11-10 ENCOUNTER — Other Ambulatory Visit: Payer: Self-pay

## 2019-11-10 ENCOUNTER — Ambulatory Visit
Admission: RE | Admit: 2019-11-10 | Discharge: 2019-11-10 | Disposition: A | Discharge status: Home / Self Care | Payer: Medicaid Other | Source: Ambulatory Visit | Attending: Orthopaedic Surgery | Admitting: Orthopaedic Surgery

## 2019-11-10 DIAGNOSIS — M79671 Pain in right foot: Secondary | ICD-10-CM

## 2019-11-10 NOTE — Progress Notes (Signed)
Needs f/u appt.  Thanks.

## 2019-11-11 ENCOUNTER — Telehealth: Payer: Self-pay | Admitting: Orthopaedic Surgery

## 2019-11-11 NOTE — Telephone Encounter (Signed)
I think [facility] has to release them to patient.

## 2019-11-11 NOTE — Telephone Encounter (Signed)
Do you know?  see message below.

## 2019-11-11 NOTE — Telephone Encounter (Signed)
Do you know. How do you release them?

## 2019-11-11 NOTE — Telephone Encounter (Signed)
Patient called asked how long does it take for the results of her MRI to show in [place]? The number to contact patient is [phone]

## 2019-11-12 MED ORDER — PREDNISONE 10 MG (21) PO TBPK: ORAL_TABLET | ORAL | 0 refills | Status: AC

## 2019-11-12 MED ORDER — MELOXICAM 7.5 MG PO TABS: 15.0000 mg | ORAL_TABLET | Freq: Every day | ORAL | 2 refills | Status: DC | PRN

## 2019-11-12 NOTE — Telephone Encounter (Signed)
Please call pt to give her results over the phone. This is the pt I discussed with you.

## 2019-11-17 ENCOUNTER — Telehealth: Payer: Self-pay | Admitting: Orthopaedic Surgery

## 2019-11-17 ENCOUNTER — Ambulatory Visit (INDEPENDENT_AMBULATORY_CARE_PROVIDER_SITE_OTHER): Payer: Medicaid Other | Admitting: Orthopaedic Surgery

## 2019-11-17 ENCOUNTER — Other Ambulatory Visit: Payer: Self-pay

## 2019-11-17 DIAGNOSIS — S86311A Strain of muscle(s) and tendon(s) of peroneal muscle group at lower leg level, right leg, initial encounter: Secondary | ICD-10-CM | POA: Diagnosis not present

## 2019-11-17 DIAGNOSIS — M7751 Other enthesopathy of right foot: Secondary | ICD-10-CM | POA: Diagnosis not present

## 2019-11-17 NOTE — Telephone Encounter (Signed)
ok 

## 2019-11-17 NOTE — Progress Notes (Signed)
   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 Peroneal tendon tear, right, initial encounter     Plan: I reviewed the MRI in detail with the patient which shows edema within the os perineum as well as split tear of the left peroneus longus tendon.  There is some edema in the lateral cuboid.  Based on discussion today patient would like to try immobilization with a cam boot.  She did notice some improvement on the steroids but this spiked her sugars so she stopped.  She is taking meloxicam which does not help as much as the steroid.  She does not want a cortisone injection.  She will let us know with the boot does not help with the pain.  Otherwise we will see her back as needed.  Follow-Up Instructions: Return if symptoms worsen or fail to improve.   Orders:  No orders of the defined types were placed in this encounter.  No orders of the defined types were placed in this encounter.     Procedures: No procedures performed   Clinical Data: No additional findings.   Subjective: Chief Complaint  Patient presents with  . Right Foot - Follow-up    [name] comes in today to review her MRI.  She continues lateral sided right foot pain.  She has no dorsal foot pain.   Review of Systems   Objective: Vital Signs: There were no vitals taken for this visit.  Physical Exam  Ortho Exam Right foot exam is unchanged. Specialty Comments:  No specialty comments available.  Imaging: No results found.   PMFS History: Patient Active Problem List   Diagnosis Date Noted  . Peroneal tendon tear, right, initial encounter 11/17/2019  . 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 Stress  incontinence 03/02/2016   No past medical history on file.  Family History  Problem Relation Age of Onset  . Diabetes Mother   . Hypertension Mother   . Diabetes Sister   . Breast cancer Maternal Grandmother   . Lung cancer Maternal Grandmother   . Heart disease Father     Past Surgical History:  Procedure Laterality Date  . CHOLECYSTECTOMY    . DILATION AND CURETTAGE OF UTERUS    . HERNIA REPAIR     Social History   Occupational History  . Not on file  Tobacco Use  . Smoking status: Former Smoker    Quit date: 03/01/1996    Years since quitting: 23.7  . Smokeless tobacco: Never Used  Substance and Sexual Activity  . Alcohol use: Yes    Alcohol/week: 0.0 standard drinks    Comment: social   . Drug use: No  . Sexual activity: Yes    Partners: Male    Birth control/protection: None

## 2019-11-17 NOTE — Telephone Encounter (Signed)
See message.

## 2019-11-17 NOTE — Telephone Encounter (Signed)
Pt called in said she has an appt today with dr.xu at 3:30, she said that today at her appt dr.xu will be giving her a cast but she is wondering if she had to put that on right away or can she wait to get home so she can drive if not will she need someone to come to drive her home?   [phone]

## 2019-11-17 NOTE — Telephone Encounter (Signed)
Probably no cast

## 2019-11-17 NOTE — Telephone Encounter (Signed)
I called patient to advise. She wanted me to let you know she Stopped steroids. Did not finish them. Only took 3 days bc her sugar levels  went  Up. She will be here at 3:30

## 2020-01-22 ENCOUNTER — Other Ambulatory Visit: Payer: Self-pay | Admitting: Physician Assistant

## 2020-01-22 ENCOUNTER — Other Ambulatory Visit: Payer: Self-pay | Admitting: Family Medicine

## 2020-01-22 DIAGNOSIS — Z1231 Encounter for screening mammogram for malignant neoplasm of breast: Secondary | ICD-10-CM

## 2020-02-12 ENCOUNTER — Other Ambulatory Visit: Payer: Self-pay | Admitting: Orthopaedic Surgery

## 2020-02-24 ENCOUNTER — Ambulatory Visit: Payer: Medicaid Other

## 2020-05-17 ENCOUNTER — Other Ambulatory Visit: Payer: Self-pay | Admitting: Physician Assistant

## 2020-06-16 ENCOUNTER — Inpatient Hospital Stay: Admission: RE | Admit: 2020-06-16 | Payer: Medicaid Other | Source: Ambulatory Visit

## 2020-06-21 ENCOUNTER — Ambulatory Visit: Payer: Medicaid Other | Admitting: Orthopaedic Surgery

## 2020-07-20 ENCOUNTER — Ambulatory Visit: Payer: Medicaid Other

## 2020-12-29 IMAGING — MR MR FOOT*R* W/O CM
5 series · 33 of 40 positions shown · non-contrast
Comparison: Radiographs dated 08/04/2019

CLINICAL DATA: Right lateral foot pain.

EXAM:
MRI OF THE RIGHT FOOT WITHOUT CONTRAST
TECHNIQUE: Multiplanar, multisequence MR imaging of the right foot was
performed. The posterior aspect of the calcaneus and the Achilles
tendon are not included on this exam. No intravenous contrast was
administered.

[Series 4: T1 · coronal · 3.0mm · 0.38mm/px · 9 of 50 slices shown (1 of 2)]
[im 1/50]
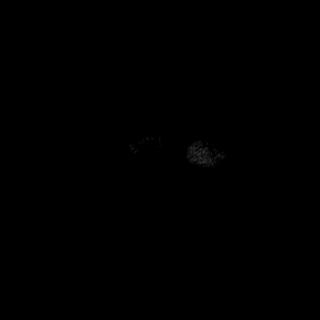
[im 9/50]
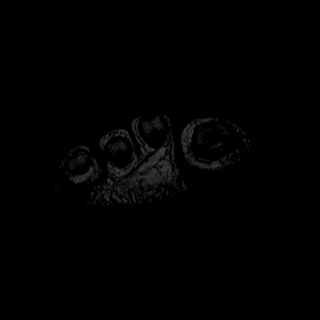
[im 14/50]
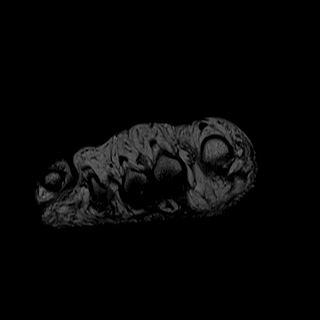
[im 23/50]
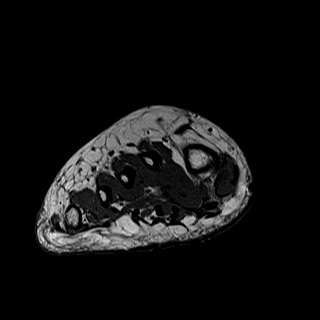
[im 27/50]
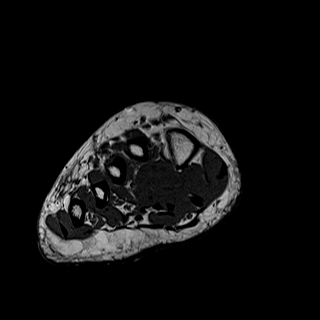
[im 36/50]
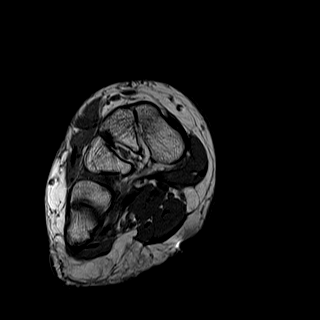
[im 41/50]
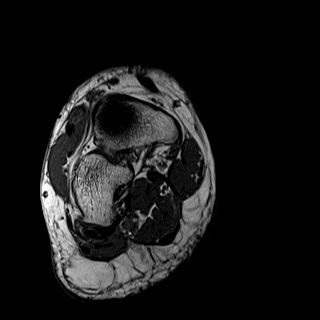
[im 45/50]
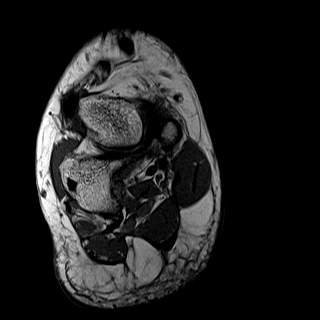
[im 50/50]
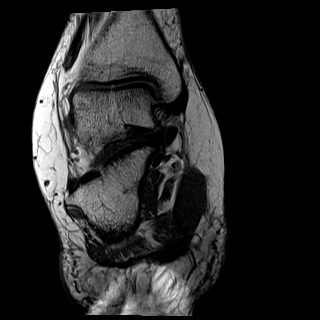

[Series 5: T2 fat-sat · coronal · 3.0mm · 0.38mm/px · 9 of 50 slices shown (1 of 2)]
[im 1/50]
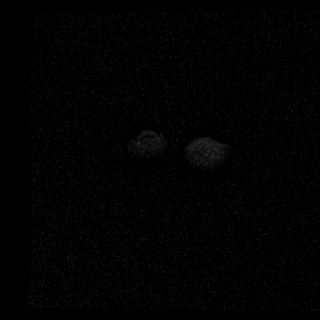
[im 9/50]
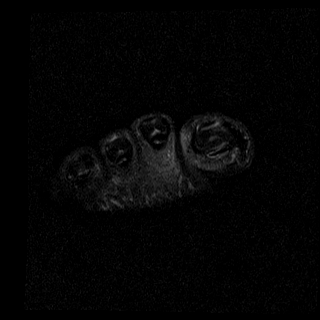
[im 14/50]
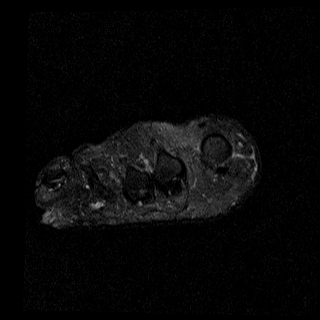
[im 23/50]
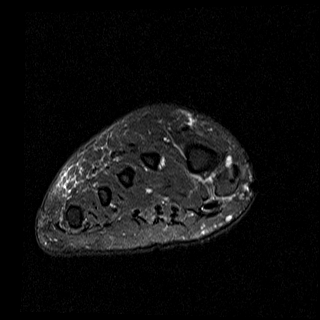
[im 27/50]
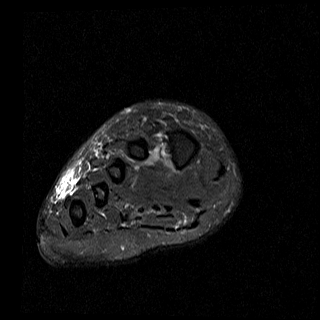
[im 36/50]
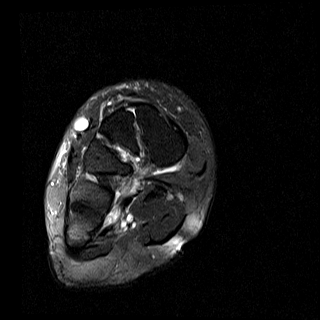
[im 41/50]
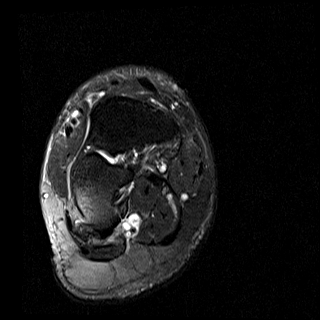
[im 45/50]
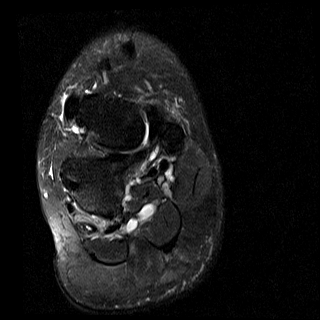
[im 50/50]
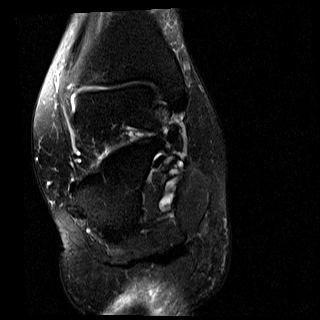

[Series 6: T1 · oblique · 3.0mm · 0.35mm/px · 5 of 22 slices shown (2 of 2)]
[im 1/22]
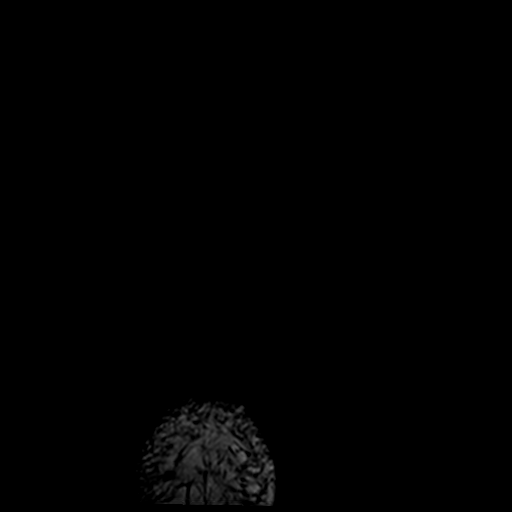
[im 6/22]
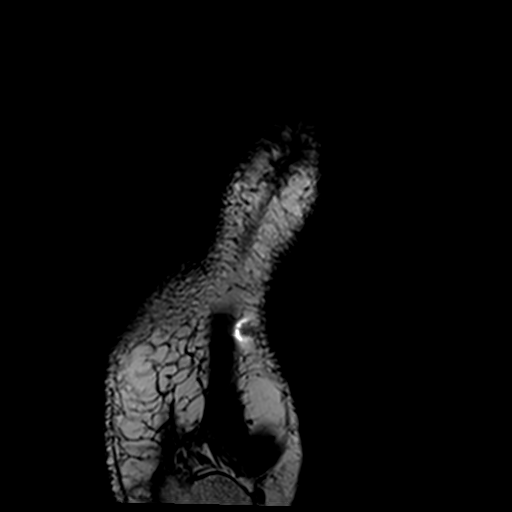
[im 11/22]
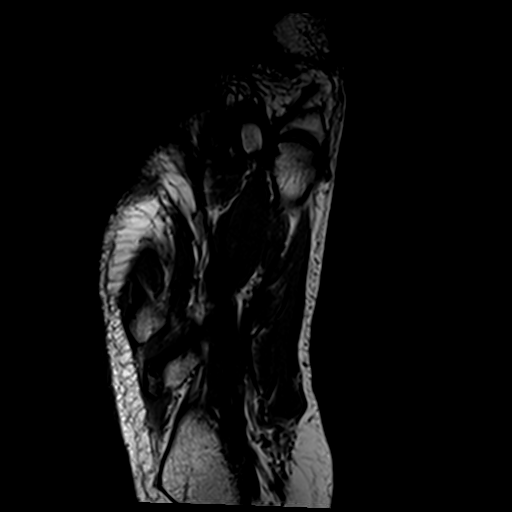
[im 16/22]
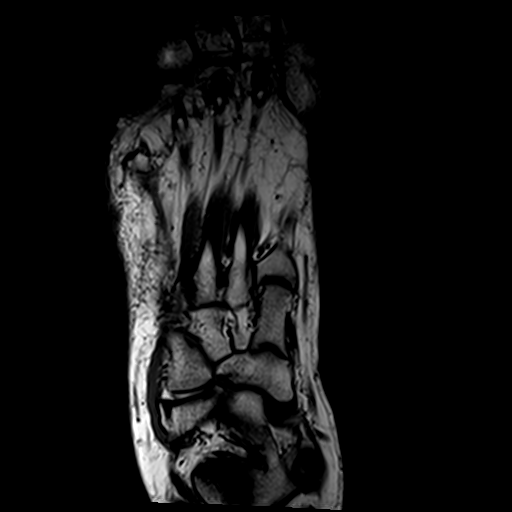
[im 22/22]
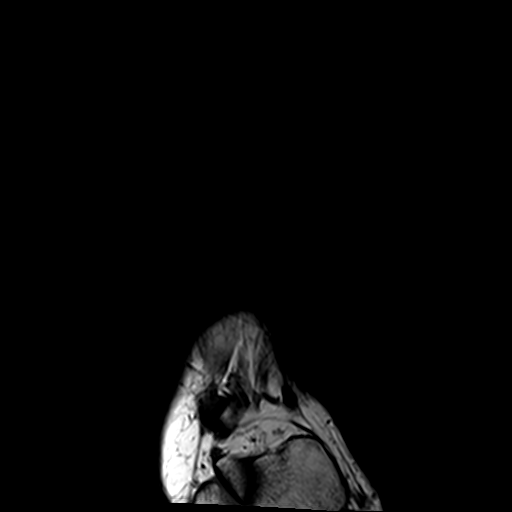

[Series 7: T2 fat-sat · oblique · 3.0mm · 0.35mm/px · 5 of 22 slices shown (2 of 2)]
[im 1/22]
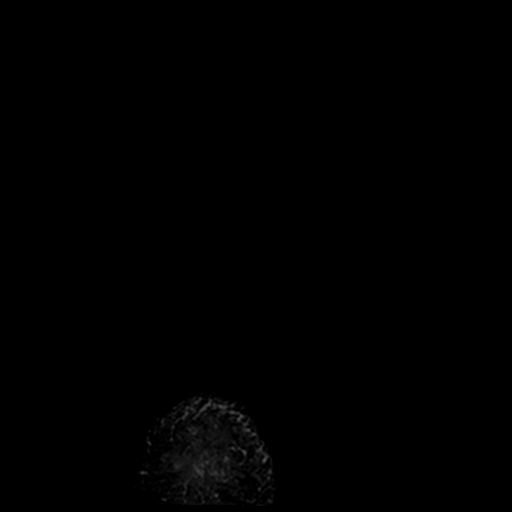
[im 6/22]
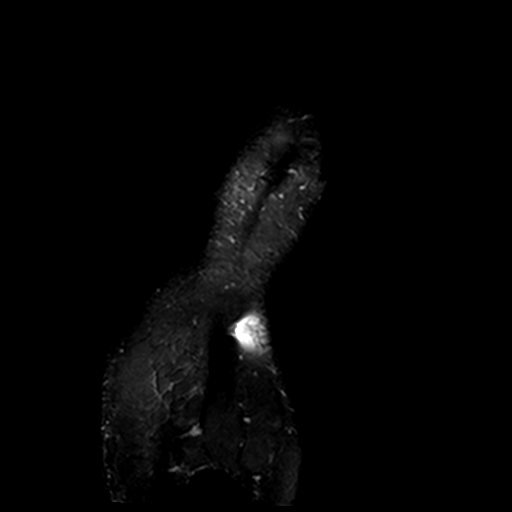
[im 11/22]
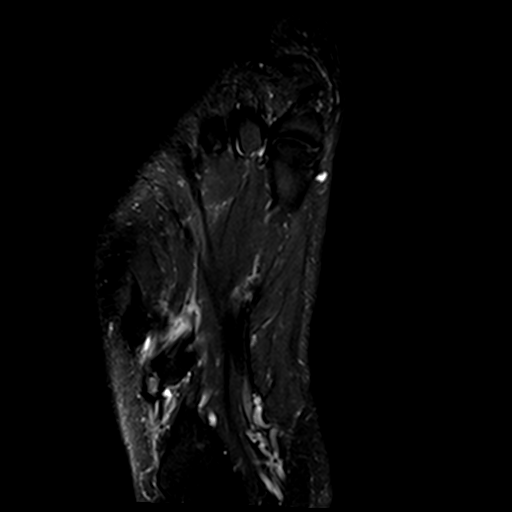
[im 16/22]
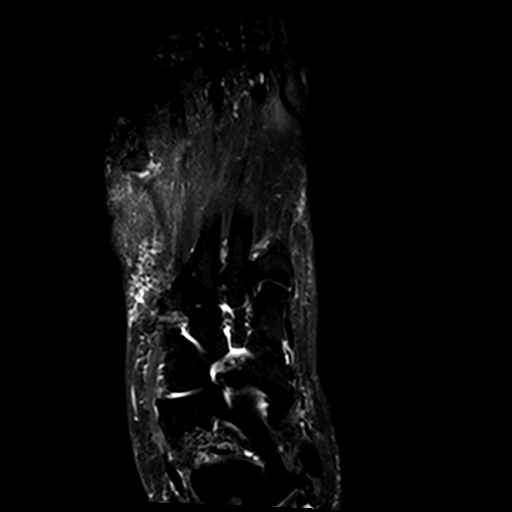
[im 22/22]
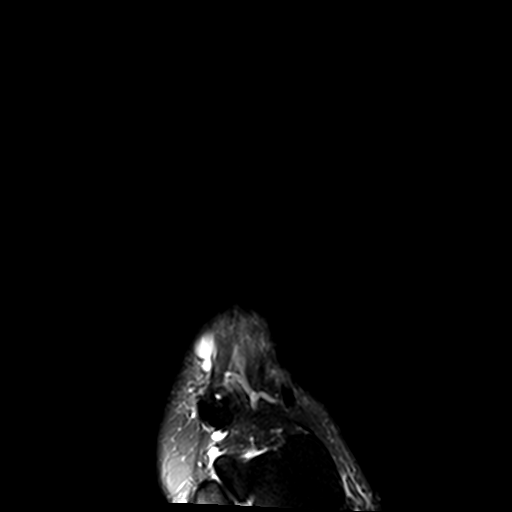

[Series 8: STIR · sagittal · 3.0mm · 0.70mm/px · 5 of 24 slices shown]
[im 1/24]
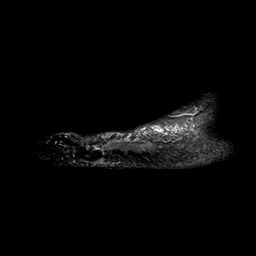
[im 5/24]
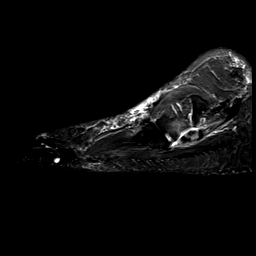
[im 10/24]
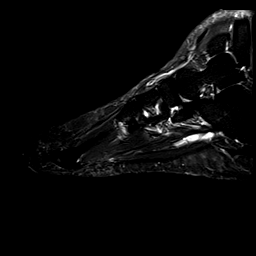
[im 14/24]
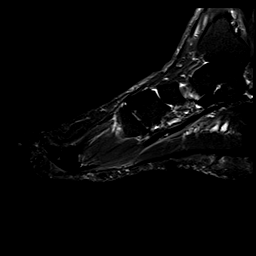
[im 19/24]
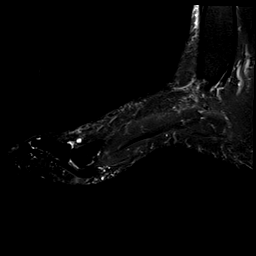

[33 of 40 positions shown; findings below may reference images not displayed]

FINDINGS: Muscles and Tendons

The patient has a normal variant of an os perineum in the peroneus
longus tendon at the level of the cuboid. However, there is edema in
the os perineum and there is an intrasubstance split tear in the
peroneus longus tendon proximal to the os perineum. There is edema
in the adjacent soft tissues and in the adjacent cuboid. This is
consistent with painful os perineum syndrome.

The peroneus brevis tendon is normal.

The visualized portions of the medial ankle tendons appear normal.
There is an oblong ganglion cyst on the dorsum of the foot
associated with the extensor digitorum longus tendons to the second
and third digits at the level of the cuneiforms.

Bones/Joint/Cartilage

Edema in the cuboid adjacent to the peroneus longus tendon as
described above. The bones are otherwise normal.

Soft tissues

No significant abnormality.
IMPRESSION: 1. Findings consistent with painful os perineum syndrome.
2. Intrasubstance split tear of the peroneus longus tendon proximal
to the os perineum.
3. Ganglion cyst associated with the extensor digitorum longus
tendons to the second and third digits at the level of the
cuneiforms.

## 2021-04-07 ENCOUNTER — Ambulatory Visit: Payer: Medicaid Other | Admitting: Orthopaedic Surgery

## 2021-04-26 ENCOUNTER — Ambulatory Visit: Payer: Medicaid Other | Admitting: Orthopaedic Surgery

## 2021-06-19 ENCOUNTER — Ambulatory Visit: Payer: Medicaid Other | Admitting: Orthopaedic Surgery

## 2021-07-20 ENCOUNTER — Ambulatory Visit (INDEPENDENT_AMBULATORY_CARE_PROVIDER_SITE_OTHER): Payer: Medicaid Other | Admitting: Orthopaedic Surgery

## 2021-07-20 ENCOUNTER — Other Ambulatory Visit: Payer: Self-pay

## 2021-07-20 ENCOUNTER — Encounter: Payer: Self-pay | Admitting: Orthopaedic Surgery

## 2021-07-20 ENCOUNTER — Ambulatory Visit (INDEPENDENT_AMBULATORY_CARE_PROVIDER_SITE_OTHER): Payer: Medicaid Other

## 2021-07-20 DIAGNOSIS — M25562 Pain in left knee: Secondary | ICD-10-CM | POA: Diagnosis not present

## 2021-07-20 DIAGNOSIS — M79672 Pain in left foot: Secondary | ICD-10-CM

## 2021-07-20 DIAGNOSIS — G8929 Other chronic pain: Secondary | ICD-10-CM

## 2021-07-20 MED ORDER — MELOXICAM 7.5 MG PO TABS: 7.5000 mg | ORAL_TABLET | Freq: Two times a day (BID) | ORAL | 2 refills | Status: AC | PRN

## 2021-07-20 NOTE — Addendum Note (Signed)
Addended by: Wendi Maya on: 07/20/2021 12:39 PM   Modules accepted: Orders

## 2021-07-20 NOTE — Progress Notes (Signed)
Office Visit Note   Patient: Terry Woods           Date of Birth: 01-18-1971           MRN: [id number] Visit Date: 07/20/2021              Requested by: Terry Mask, MD [address] [place],  [place] [address] PCP: Terry Mask, MD   Assessment & Plan: Visit Diagnoses:  1. Chronic pain of left knee   2. Left foot pain     Plan: In terms of the left knee impression is medial meniscal tear and given failure of conservative treatments we will obtain an the left foot I have recommended a supportive shoe and possibly getting back into the cam boot for couple weeks to let things calm down.  Also consider that the left foot could be symptomatic due to meloxicam prescribed today.  Follow-up after the MRI.  Follow-Up Instructions: Return for follow up after MRI.   Orders:  Orders Placed This Encounter  Procedures   XR Foot Complete Left   XR KNEE 3 VIEW LEFT   Meds ordered this encounter  Medications   meloxicam (MOBIC) 7.5 MG tablet    Sig: Take 1 tablet (7.5 mg total) by mouth 2 (two) times daily as needed for pain.    Dispense:  30 tablet    Refill:  2      Procedures: No procedures performed   Clinical Data: No additional findings.   Subjective: Chief Complaint  Patient presents with   Left Knee - Pain   Left Foot - Pain   Lower Back - Pain    Terry Woods is a 50 year old female who I have treated in the past for other issues who comes in to see me for evaluation of left knee pain and left foot pain.  She was evaluated at [facility] couple months ago for the same issues and unfortunately her pain has not improved.  She has significant medial knee pain that has catching and sharp stabbing pain.  She also has lateral sided foot pain.  I did see her a couple years ago for lateral side of right foot pain which showed peroneus brevis tendinopathy and os perineum.  She has been using Tylenol and Biofreeze since she saw [facility].   Review of  Systems   Objective: Vital Signs: There were no vitals taken for this visit.  Physical Exam  Ortho Exam Left knee shows trace effusion.  Significant medial joint line tenderness.  Increased pain with flexion of the knee past 90degrees.  Causing cruciates are stable.  Left foot shows no swelling or skin changes.  She does have bony tenderness at the base of the fifth metatarsal and proximal to this near the insertion of the.  He is brevis. Specialty Comments:  No specialty comments available.  Imaging: XR Foot Complete Left  Result Date: 07/20/2021 Presence of an os perineum.  No acute abnormalities.  XR KNEE 3 VIEW LEFT  Result Date: 07/20/2021 Mild osteoarthritis.  No significant degenerative changes.    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 No pertinent past medical history.  Family History  Problem Relation Age of Onset   Diabetes Mother    Hypertension Mother    Diabetes Sister    Breast cancer Maternal Grandmother  Lung cancer Maternal Grandmother    Heart disease Father     Past Surgical History:  Procedure Laterality Date   CHOLECYSTECTOMY     DILATION AND CURETTAGE OF UTERUS     HERNIA REPAIR     Social History   Occupational History   Not on file  Tobacco Use   Smoking status: Former    Types: Cigarettes    Quit date: 03/01/1996    Years since quitting: 25.4   Smokeless tobacco: Never  Substance and Sexual Activity   Alcohol use: Yes    Alcohol/week: 0.0 standard drinks    Comment: social    Drug use: No   Sexual activity: Yes    Partners: Male    Birth control/protection: None

## 2021-07-28 ENCOUNTER — Ambulatory Visit
Admission: RE | Admit: 2021-07-28 | Discharge: 2021-07-28 | Disposition: A | Discharge status: Home / Self Care | Payer: Medicaid Other | Source: Ambulatory Visit | Attending: Orthopaedic Surgery | Admitting: Orthopaedic Surgery

## 2021-07-28 ENCOUNTER — Other Ambulatory Visit: Payer: Self-pay

## 2021-07-28 DIAGNOSIS — M25562 Pain in left knee: Secondary | ICD-10-CM

## 2021-07-28 DIAGNOSIS — G8929 Other chronic pain: Secondary | ICD-10-CM

## 2021-08-08 ENCOUNTER — Ambulatory Visit: Payer: Medicaid Other | Admitting: Orthopaedic Surgery

## 2021-08-11 ENCOUNTER — Encounter: Payer: Self-pay | Admitting: Orthopaedic Surgery

## 2021-08-14 NOTE — Telephone Encounter (Signed)
Sure that's fine to get.  Thanks.

## 2021-08-15 ENCOUNTER — Other Ambulatory Visit: Payer: Self-pay

## 2021-08-15 DIAGNOSIS — M25562 Pain in left knee: Secondary | ICD-10-CM

## 2021-08-15 DIAGNOSIS — M79672 Pain in left foot: Secondary | ICD-10-CM

## 2021-08-15 DIAGNOSIS — G8929 Other chronic pain: Secondary | ICD-10-CM

## 2021-08-24 ENCOUNTER — Other Ambulatory Visit: Payer: Medicaid Other

## 2021-08-25 ENCOUNTER — Other Ambulatory Visit: Payer: Medicaid Other

## 2021-09-11 ENCOUNTER — Other Ambulatory Visit: Payer: Medicaid Other

## 2021-09-22 ENCOUNTER — Telehealth: Payer: Self-pay | Admitting: Orthopaedic Surgery

## 2021-09-22 NOTE — Telephone Encounter (Signed)
Pt called requesting a call back from [place] concerning note. Pt states the insurance company denied MRI due to notes that need to be revised by Dr. Roda Shutters. Please call pt at [phone].

## 2021-09-25 NOTE — Telephone Encounter (Signed)
   I called and updated pt and the above is the secure chatted from imaging

## 2021-09-27 ENCOUNTER — Encounter: Payer: Self-pay | Admitting: Orthopaedic Surgery

## 2021-10-06 ENCOUNTER — Telehealth: Payer: Self-pay | Admitting: Orthopaedic Surgery

## 2021-10-06 NOTE — Telephone Encounter (Signed)
Unfortunately CT scan will not show a meniscus tear.

## 2021-10-06 NOTE — Telephone Encounter (Signed)
Patient would like to know if we can Get CT scan instead since MRI was denied. Appeal letter was done by Dr. [place] approval/denial. Per patient something needs to be done to figure out what's going on with her foot. She is  having a lot of pain and hurts to WB. Please advise.

## 2021-10-06 NOTE — Telephone Encounter (Signed)
Pt called requesting a call back from [place]. Pt did not state the nature of her call. Please call pt at [phone].

## 2021-10-07 ENCOUNTER — Other Ambulatory Visit: Payer: Medicaid Other

## 2021-10-09 NOTE — Telephone Encounter (Signed)
Sent mychart msg to patient.  Martie Lee do we know the status of the MRI-denial-appeal?

## 2021-10-09 NOTE — Telephone Encounter (Signed)
Still pending appeal authorization

## 2021-10-13 ENCOUNTER — Telehealth: Payer: Self-pay | Admitting: Orthopaedic Surgery

## 2021-10-13 NOTE — Telephone Encounter (Signed)
Additional **  Also they have to have members consent so they will attempt to call pt. If pt does not answer they will fax a letter the pt will need to sign.

## 2021-10-13 NOTE — Telephone Encounter (Signed)
[employer] appeals dept called and states they need additions records to file the appeal.   FAX # [phone] [facility] # [phone]

## 2021-10-18 NOTE — Telephone Encounter (Signed)
Went thru [employer] and this has been approved. Will send message secure to imaging to contact pt to reschedue appt

## 2021-10-24 ENCOUNTER — Other Ambulatory Visit: Payer: Self-pay

## 2021-10-24 ENCOUNTER — Ambulatory Visit
Admission: RE | Admit: 2021-10-24 | Discharge: 2021-10-24 | Disposition: A | Discharge status: Home / Self Care | Payer: Medicaid Other | Source: Ambulatory Visit | Attending: Orthopaedic Surgery | Admitting: Orthopaedic Surgery

## 2021-10-24 DIAGNOSIS — M79672 Pain in left foot: Secondary | ICD-10-CM

## 2021-11-05 ENCOUNTER — Other Ambulatory Visit: Payer: Medicaid Other

## 2021-11-08 ENCOUNTER — Ambulatory Visit: Payer: Medicaid Other | Admitting: Orthopaedic Surgery

## 2022-06-20 ENCOUNTER — Ambulatory Visit: Payer: Medicaid Other | Admitting: Podiatry
